# Patient Record
Sex: Female | Born: 1950 | Hispanic: No | Marital: Married | Smoking: Never smoker
Health system: Southern US, Community
[De-identification: ages and names within clinical notes are randomized; demographics above are authoritative.]

## PROBLEM LIST (undated history)

## (undated) DIAGNOSIS — E7800 Pure hypercholesterolemia, unspecified: Secondary | ICD-10-CM

---

## 2008-05-15 ENCOUNTER — Other Ambulatory Visit: Admission: RE | Admit: 2008-05-15 | Discharge: 2008-05-15 | Payer: Self-pay | Admitting: Family Medicine

## 2009-07-24 ENCOUNTER — Other Ambulatory Visit: Admission: RE | Admit: 2009-07-24 | Discharge: 2009-07-24 | Payer: Self-pay | Admitting: Family Medicine

## 2010-12-03 ENCOUNTER — Other Ambulatory Visit (HOSPITAL_COMMUNITY)
Admission: RE | Admit: 2010-12-03 | Discharge: 2010-12-03 | Disposition: A | Discharge status: Home / Self Care | Payer: BC Managed Care – PPO | Source: Ambulatory Visit | Attending: Family Medicine | Admitting: Family Medicine

## 2010-12-03 ENCOUNTER — Other Ambulatory Visit: Payer: Self-pay | Admitting: Family Medicine

## 2010-12-03 DIAGNOSIS — Z124 Encounter for screening for malignant neoplasm of cervix: Secondary | ICD-10-CM | POA: Insufficient documentation

## 2011-12-07 ENCOUNTER — Other Ambulatory Visit (HOSPITAL_COMMUNITY)
Admission: RE | Admit: 2011-12-07 | Discharge: 2011-12-07 | Disposition: A | Discharge status: Home / Self Care | Payer: BC Managed Care – PPO | Source: Ambulatory Visit | Attending: Family Medicine | Admitting: Family Medicine

## 2011-12-07 ENCOUNTER — Other Ambulatory Visit: Payer: Self-pay | Admitting: Family Medicine

## 2011-12-07 DIAGNOSIS — Z124 Encounter for screening for malignant neoplasm of cervix: Secondary | ICD-10-CM | POA: Insufficient documentation

## 2013-05-14 ENCOUNTER — Other Ambulatory Visit: Payer: Self-pay | Admitting: Family Medicine

## 2013-05-14 ENCOUNTER — Other Ambulatory Visit (HOSPITAL_COMMUNITY)
Admission: RE | Admit: 2013-05-14 | Discharge: 2013-05-14 | Disposition: A | Discharge status: Home / Self Care | Payer: Managed Care, Other (non HMO) | Source: Ambulatory Visit | Attending: Family Medicine | Admitting: Family Medicine

## 2013-05-14 DIAGNOSIS — Z124 Encounter for screening for malignant neoplasm of cervix: Secondary | ICD-10-CM | POA: Insufficient documentation

## 2013-05-14 DIAGNOSIS — Z1151 Encounter for screening for human papillomavirus (HPV): Secondary | ICD-10-CM | POA: Insufficient documentation

## 2013-05-22 ENCOUNTER — Other Ambulatory Visit: Payer: Self-pay | Admitting: Family Medicine

## 2013-05-22 ENCOUNTER — Ambulatory Visit
Admission: RE | Admit: 2013-05-22 | Discharge: 2013-05-22 | Disposition: A | Discharge status: Home / Self Care | Payer: Managed Care, Other (non HMO) | Source: Ambulatory Visit | Attending: Family Medicine | Admitting: Family Medicine

## 2013-05-22 DIAGNOSIS — R079 Chest pain, unspecified: Secondary | ICD-10-CM

## 2014-05-16 ENCOUNTER — Other Ambulatory Visit: Payer: Self-pay | Admitting: Family Medicine

## 2014-05-16 ENCOUNTER — Other Ambulatory Visit (HOSPITAL_COMMUNITY)
Admission: RE | Admit: 2014-05-16 | Discharge: 2014-05-16 | Disposition: A | Discharge status: Home / Self Care | Payer: Managed Care, Other (non HMO) | Source: Ambulatory Visit | Attending: Family Medicine | Admitting: Family Medicine

## 2014-05-16 DIAGNOSIS — Z124 Encounter for screening for malignant neoplasm of cervix: Secondary | ICD-10-CM | POA: Diagnosis not present

## 2014-05-17 LAB — CYTOLOGY - PAP

## 2014-06-25 IMAGING — CR DG RIBS W/ CHEST 3+V*L*
3 series · 3 of 3 positions shown · non-contrast
Comparison: None.

CLINICAL DATA: Chest pain

LEFT RIBS AND CHEST - 3+ VIEW

[w chest pa]
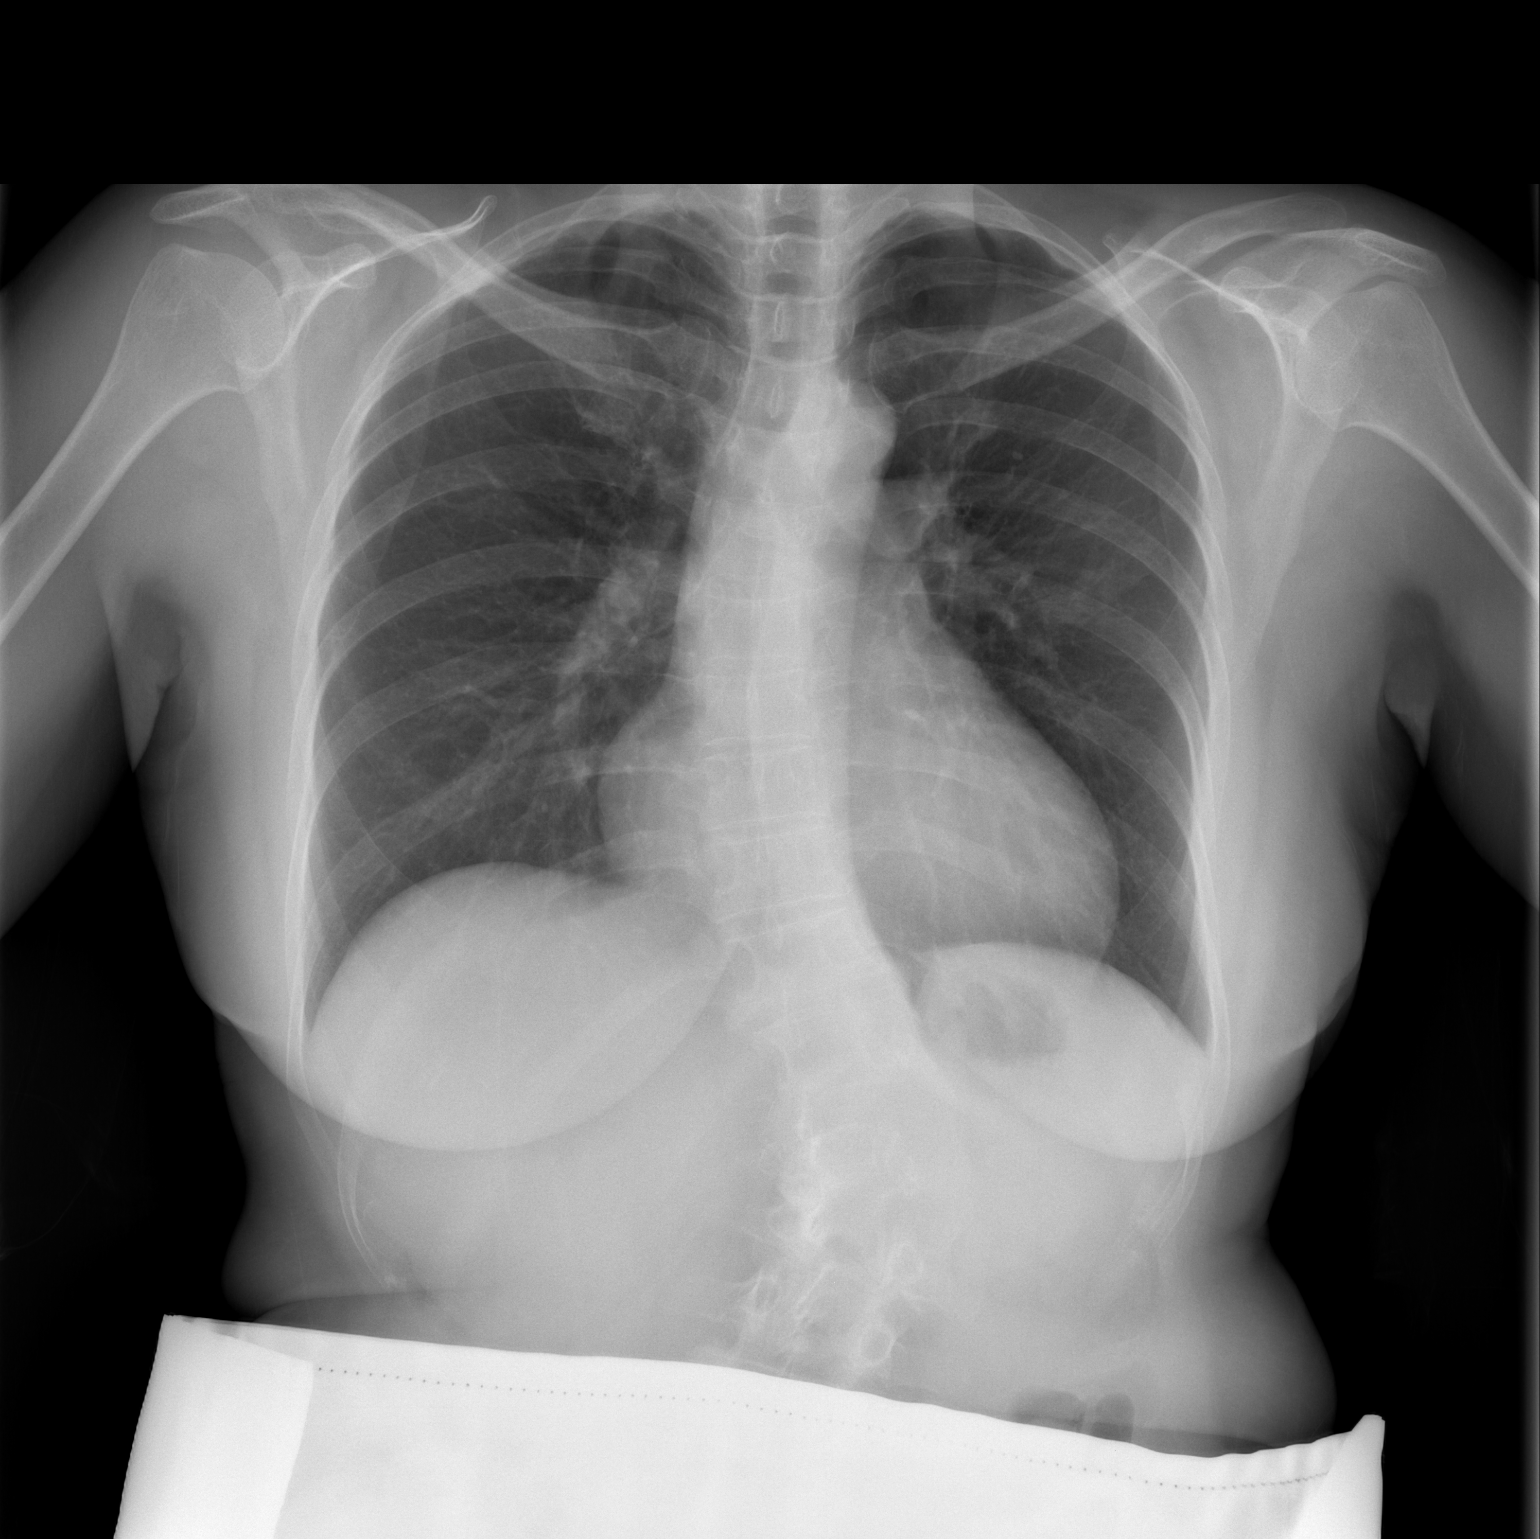

[w ribs ap/pa upper left *]
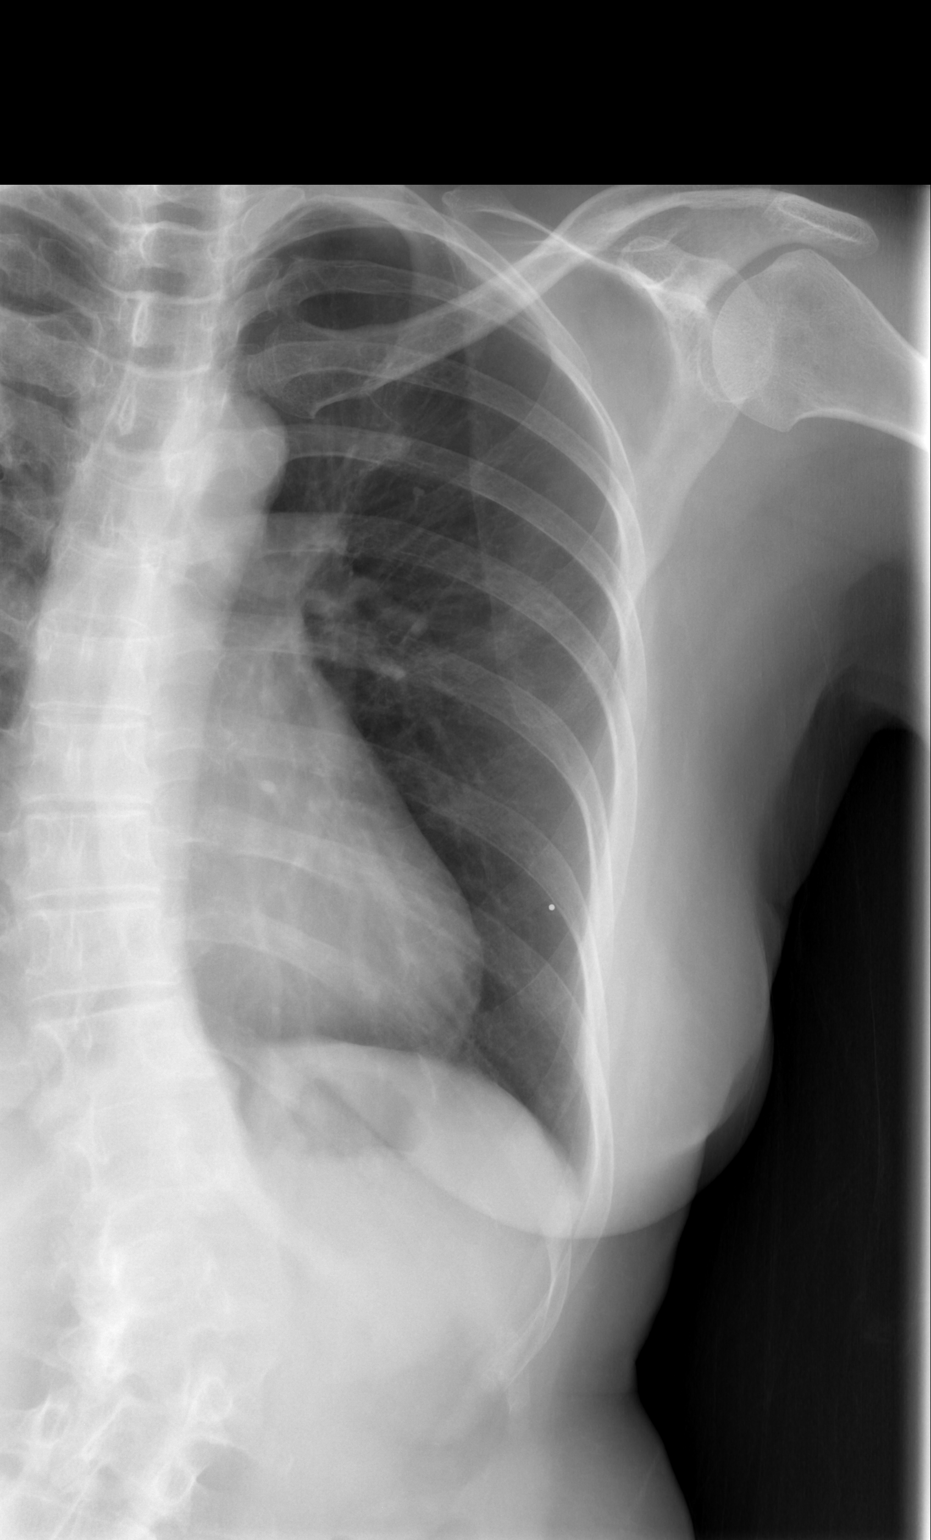

[w ribs oblique left *]
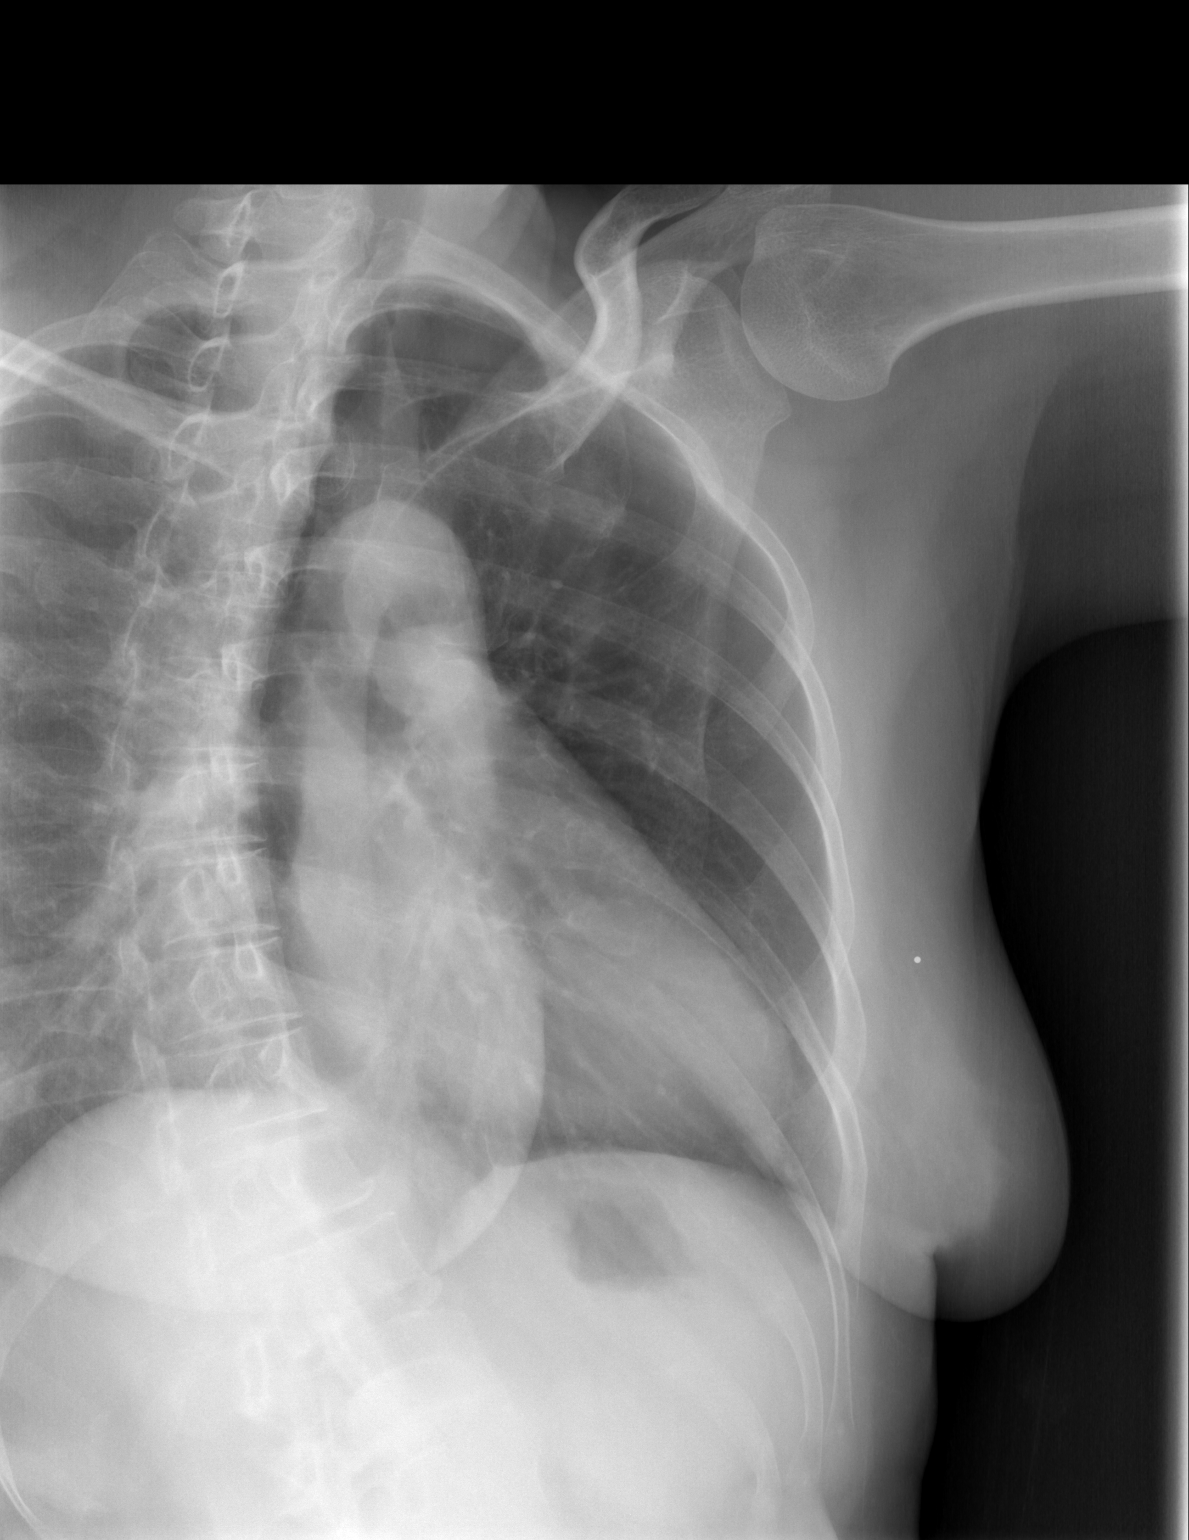

[3 of 3 positions shown; findings below may reference images not displayed]

FINDINGS: Moderate levoscoliosis in the upper lumbar spine.

Mild cardiac enlargement.  Negative for heart failure.  Lungs are
clear without infiltrate or effusion.

Negative for left rib fracture.  No rib lesion on the left.
IMPRESSION: Scoliosis.  No acute abnormality.

## 2015-07-07 ENCOUNTER — Emergency Department (HOSPITAL_COMMUNITY): Payer: Self-pay

## 2015-07-07 ENCOUNTER — Encounter (HOSPITAL_COMMUNITY): Payer: Self-pay | Admitting: Emergency Medicine

## 2015-07-07 ENCOUNTER — Observation Stay (HOSPITAL_COMMUNITY)
Admission: EM | Admit: 2015-07-07 | Discharge: 2015-07-08 | Disposition: A | Discharge status: Home / Self Care | Payer: Self-pay | Attending: Internal Medicine | Admitting: Internal Medicine

## 2015-07-07 DIAGNOSIS — E785 Hyperlipidemia, unspecified: Secondary | ICD-10-CM

## 2015-07-07 DIAGNOSIS — R0789 Other chest pain: Principal | ICD-10-CM | POA: Insufficient documentation

## 2015-07-07 DIAGNOSIS — Z79899 Other long term (current) drug therapy: Secondary | ICD-10-CM | POA: Insufficient documentation

## 2015-07-07 DIAGNOSIS — Z7982 Long term (current) use of aspirin: Secondary | ICD-10-CM | POA: Insufficient documentation

## 2015-07-07 DIAGNOSIS — R079 Chest pain, unspecified: Secondary | ICD-10-CM | POA: Diagnosis present

## 2015-07-07 DIAGNOSIS — E7800 Pure hypercholesterolemia, unspecified: Secondary | ICD-10-CM | POA: Insufficient documentation

## 2015-07-07 DIAGNOSIS — R072 Precordial pain: Secondary | ICD-10-CM

## 2015-07-07 HISTORY — DX: Pure hypercholesterolemia, unspecified: E78.00

## 2015-07-07 LAB — CBC
HCT: 40.3 % (ref 36.0–46.0)
Hemoglobin: 13.7 g/dL (ref 12.0–15.0)
MCH: 32 pg (ref 26.0–34.0)
MCHC: 34 g/dL (ref 30.0–36.0)
MCV: 94.2 fL (ref 78.0–100.0)
PLATELETS: 214 10*3/uL (ref 150–400)
RBC: 4.28 MIL/uL (ref 3.87–5.11)
RDW: 12.8 % (ref 11.5–15.5)
WBC: 5.5 10*3/uL (ref 4.0–10.5)

## 2015-07-07 LAB — BASIC METABOLIC PANEL
Anion gap: 9 (ref 5–15)
BUN: 20 mg/dL (ref 6–20)
CALCIUM: 9.7 mg/dL (ref 8.9–10.3)
CO2: 26 mmol/L (ref 22–32)
CREATININE: 0.77 mg/dL (ref 0.44–1.00)
Chloride: 104 mmol/L (ref 101–111)
GFR calc Af Amer: >60 mL/min (ref >60)
GFR calc non Af Amer: >60 mL/min (ref >60)
GLUCOSE: 106 mg/dL — AB (ref 65–99)
Potassium: 4.1 mmol/L (ref 3.5–5.1)
Sodium: 139 mmol/L (ref 135–145)

## 2015-07-07 LAB — I-STAT TROPONIN, ED: TROPONIN I, POC: 0 ng/mL (ref 0.00–0.08)

## 2015-07-07 NOTE — ED Notes (Signed)
Pt. reports left lower chest pain onset this morning with mild SOB , nausea  and diaphoresis , pt. took 1 baby ASA prior to arrival .

## 2015-07-08 ENCOUNTER — Observation Stay (HOSPITAL_COMMUNITY): Payer: Managed Care, Other (non HMO)

## 2015-07-08 ENCOUNTER — Encounter (HOSPITAL_COMMUNITY): Payer: Self-pay | Admitting: Urology

## 2015-07-08 ENCOUNTER — Other Ambulatory Visit (HOSPITAL_COMMUNITY): Payer: Self-pay | Admitting: Physician Assistant

## 2015-07-08 DIAGNOSIS — R079 Chest pain, unspecified: Secondary | ICD-10-CM

## 2015-07-08 DIAGNOSIS — R072 Precordial pain: Secondary | ICD-10-CM

## 2015-07-08 DIAGNOSIS — E785 Hyperlipidemia, unspecified: Secondary | ICD-10-CM

## 2015-07-08 LAB — NM MYOCAR MULTI W/SPECT W/WALL MOTION / EF
CHL CUP MPHR: 156 {beats}/min
CHL CUP RESTING HR STRESS: 55 {beats}/min
CSEPPHR: 103 {beats}/min
Percent HR: 66 %

## 2015-07-08 LAB — TROPONIN I
Troponin I: <0.03 ng/mL
Troponin I: <0.03 ng/mL

## 2015-07-08 LAB — D-DIMER, QUANTITATIVE: D-Dimer, Quant: 0.29 ug{FEU}/mL (ref 0.00–0.48)

## 2015-07-08 MED ORDER — ACETAMINOPHEN 325 MG PO TABS: 650.0000 mg | ORAL_TABLET | ORAL | Status: DC | PRN

## 2015-07-08 MED ORDER — TECHNETIUM TC 99M SESTAMIBI GENERIC - CARDIOLITE
30.0000 | Freq: Once | INTRAVENOUS | Status: AC | PRN
Start: 2015-07-08 — End: 2015-07-08
  Administered 2015-07-08: 30 via INTRAVENOUS

## 2015-07-08 MED ORDER — REGADENOSON 0.4 MG/5ML IV SOLN
0.4000 mg | Freq: Once | INTRAVENOUS | Status: AC
  Administered 2015-07-08: 0.4 mg via INTRAVENOUS
  Filled 2015-07-08: qty 5

## 2015-07-08 MED ORDER — ASPIRIN EC 81 MG PO TBEC
81.0000 mg | DELAYED_RELEASE_TABLET | Freq: Every day | ORAL | Status: DC
  Administered 2015-07-08: 81 mg via ORAL
  Filled 2015-07-08: qty 1

## 2015-07-08 MED ORDER — TECHNETIUM TC 99M SESTAMIBI GENERIC - CARDIOLITE
10.0000 | Freq: Once | INTRAVENOUS | Status: AC | PRN
  Administered 2015-07-08: 10 via INTRAVENOUS

## 2015-07-08 MED ORDER — REGADENOSON 0.4 MG/5ML IV SOLN
INTRAVENOUS | Status: AC
  Administered 2015-07-08: 0.4 mg via INTRAVENOUS
  Filled 2015-07-08: qty 5

## 2015-07-08 MED ORDER — ASPIRIN 81 MG PO CHEW
162.0000 mg | CHEWABLE_TABLET | Freq: Once | ORAL | Status: AC
  Administered 2015-07-08: 162 mg via ORAL
  Filled 2015-07-08: qty 2

## 2015-07-08 MED ORDER — ONDANSETRON HCL 4 MG/2ML IJ SOLN: 4.0000 mg | Freq: Four times a day (QID) | INTRAMUSCULAR | Status: DC | PRN

## 2015-07-08 MED ORDER — ENOXAPARIN SODIUM 40 MG/0.4ML ~~LOC~~ SOLN
40.0000 mg | SUBCUTANEOUS | Status: DC
  Administered 2015-07-08: 40 mg via SUBCUTANEOUS
  Filled 2015-07-08: qty 0.4

## 2015-07-08 MED ORDER — ALUM & MAG HYDROXIDE-SIMETH 200-200-20 MG/5ML PO SUSP: 15.0000 mL | ORAL | Status: DC | PRN

## 2015-07-08 MED ORDER — SIMVASTATIN 20 MG PO TABS
20.0000 mg | ORAL_TABLET | Freq: Every day | ORAL | Status: DC
  Administered 2015-07-08: 20 mg via ORAL
  Filled 2015-07-08: qty 1

## 2015-07-08 NOTE — Consult Note (Signed)
CARDIOLOGY CONSULT NOTE  Patient ID: Helen King, MRN: [id number], DOB/AGE: 05-12-1951 64 y.o. Admit date: 07/07/2015 Date of Consult: 07/08/2015  Primary Physician: Dr Stephanie Acre Primary Cardiologist: none Referring Physician: Dr Alcario Drought  Chief Complaint: Chest pain Reason for Consultation: Chest pain  HPI: 64 year old woman who presented for evaluation of chest pain. The patient has no personal history of cardiac disease. She was driving home from vacation in [place] with her husband when she developed pain under the left breast. Pain is described as a pressure-like sensation. The pain was nonradiating and was not associated with deep breathing or coughing. She had one similar episode but this was many years ago and she did not seek evaluation. Symptoms lasted approximately one hour. There was associated nausea and she states she felt hot but did not break out in a sweat. There was no associated dyspnea, back pain, lightheadedness, or heart palpitations. Her symptoms have resolved this morning. Cardiology is asked to evaluate her for consideration of further testing.  The patient is treated for hypercholesterolemia. She reports compliance with a statin drug. She also takes aspirin 81 mg daily. She has no history of hypertension and she does not smoke cigarettes.  Medical History:  Past Medical History  Diagnosis Date  . Hypercholesterolemia       Surgical History: History reviewed. No pertinent past surgical history.   Home Meds: Prior to Admission medications   Medication Sig Start Date End Date Taking? Authorizing Provider  aspirin EC 81 MG tablet Take 81 mg by mouth daily.   Yes Historical Provider, MD  cholecalciferol (VITAMIN D) 1000 UNITS tablet Take 1,000 Units by mouth daily.   Yes Historical Provider, MD  Multiple Vitamin (MULTIVITAMIN WITH MINERALS) TABS tablet Take 1 tablet by mouth daily.   Yes Historical Provider, MD  Omega-3 Fatty Acids (FISH OIL) 1000 MG CAPS Take  1,000 mg by mouth daily.   Yes Historical Provider, MD  simvastatin (ZOCOR) 20 MG tablet Take 20 mg by mouth daily. 04/15/15  Yes Historical Provider, MD  TURMERIC PO Take 1 Dose by mouth daily.   Yes Historical Provider, MD  vitamin C (ASCORBIC ACID) 250 MG tablet Take 250 mg by mouth daily.   Yes Historical Provider, MD    Inpatient Medications:  . aspirin EC  81 mg Oral Daily  . enoxaparin (LOVENOX) injection  40 mg Subcutaneous Q24H  . simvastatin  20 mg Oral Daily      Allergies: No Known Allergies  Social History   Social History  . Marital Status: Married    Spouse Name: N/A  . Number of Children: N/A  . Years of Education: N/A   Occupational History  . Not on file.   Social History Main Topics  . Smoking status: Never Smoker   . Smokeless tobacco: Not on file  . Alcohol Use: Yes  . Drug Use: No  . Sexual Activity: Not on file   Other Topics Concern  . Not on file   Social History Narrative     Family history: There is no history of premature coronary artery disease in any first-degree relatives  Review of Systems: General: negative for chills, fever, night sweats or weight changes.  ENT: negative for rhinorrhea or epistaxis Cardiovascular: See history of present illness Dermatological: negative for rash Respiratory: negative for cough or wheezing GI: Positive for nausea; negative for vomiting, diarrhea, bright red blood per rectum, melena, or hematemesis GU: no hematuria, urgency, or frequency Neurologic: negative for visual changes, syncope,  headache, or dizziness Heme: no easy bruising or bleeding Endo: negative for excessive thirst, thyroid disorder, or flushing Musculoskeletal: negative for joint pain or swelling, negative for myalgias  All other systems reviewed and are otherwise negative except as noted above.  Physical Exam: Blood pressure 111/66, pulse 54, temperature 98 F (36.7 C), temperature source Oral, resp. rate 16, height 5' (1.524 m),  weight 118 lb 3.2 oz (53.615 kg), SpO2 95 %. Pt is alert and oriented, WD, WN, in no distress. HEENT: normal Neck: JVP normal. Carotid upstrokes normal without bruits. No thyromegaly. Lungs: equal expansion, clear bilaterally CV: Apex is discrete and nondisplaced, RRR without murmur or gallop Abd: soft, NT, +BS, no bruit, no hepatosplenomegaly Back: no CVA tenderness Ext: no C/C/E        DP/PT pulses intact and = Skin: warm and dry without rash Neuro: CNII-XII intact             Strength intact = bilaterally   Labs:  Recent Labs  07/08/15 0250 07/08/15 0840  TROPONINI <0.03 <0.03   Lab Results  Component Value Date   WBC 5.5 07/07/2015   HGB 13.7 07/07/2015   HCT 40.3 07/07/2015   MCV 94.2 07/07/2015   PLT 214 07/07/2015    Recent Labs Lab 07/07/15 2100  NA 139  K 4.1  CL 104  CO2 26  BUN 20  CREATININE 0.77  CALCIUM 9.7  GLUCOSE 106*   No results found for: CHOL, HDL, LDLCALC, TRIG Lab Results  Component Value Date   DDIMER 0.29 07/08/2015    Radiology/Studies:  Dg Chest 2 View  07/07/2015   CLINICAL DATA:  Left lower chest pain with mild dyspnea and nausea. Diaphoresis. Onset this morning.  EXAM: CHEST  2 VIEW  COMPARISON:  05/22/13  FINDINGS: The heart size and mediastinal contours are within normal limits. Both lungs are clear. The visualized skeletal structures are unremarkable.  IMPRESSION: No active cardiopulmonary disease.   Electronically Signed   By: Andreas Newport M.D.   On: 07/07/2015 21:12    EKG: NSR, within normal limits  Cardiac Studies: Troponin negative x 2  ASSESSMENT AND PLAN:  1. Chest pain with typical and atypical features: Patient's primary cardiovascular risk factor is hypercholesterolemia. D-dimer is negative making pulmonary embolus unlikely. Further risk stratification is indicated, and I have recommended a gated coronary CTA to evaluate for obstructive coronary artery disease. Further disposition pending the results of  testing. The patient is chest pain-free at present.  2. Hypercholesterolemia: Patient treated with simvastatin, followed by primary care physician.  Deatra James MD, [facility] 07/08/2015, 9:34 AM

## 2015-07-08 NOTE — Progress Notes (Signed)
Pt arrived to floor in NAD, no pain, VSS, pt oriented to room and floor, call bell and phone within reach.

## 2015-07-08 NOTE — Progress Notes (Signed)
    Patient's nuclear stress test returned non diagnostic due to a large amount of gut attenuation. She has had no further chest pain. I spoke with Dr. Burt Knack who felt like she could be discharged home with outpatient stress ECHO. I have arranged for this to be done tomorrow morning with Richardson Dopp PA-C in our [address] [facility] and I will see her in follow up on Friday afternoon. I spoke with the patient who is agreeable with this plan.   Angelena Form PA-C  [facility]

## 2015-07-08 NOTE — Discharge Summary (Addendum)
Physician Discharge Summary  Helen King VPX:[id number] DOB: [birth year] DOA: 07/07/2015  PCP: No primary care provider on file.  Admit date: 07/07/2015 Discharge date: 07/08/2015  Time spent: 20 minutes  Recommendations for Outpatient Follow-up:  1. Follow up with outpatient stress echo as scheduled 2. Follow up with PCP in 1-2 weeks  Discharge Diagnoses:  Active Problems:   Chest pain with low risk for cardiac etiology   Chest pain   HLD (hyperlipidemia)   Discharge Condition: Stable  Diet recommendation: Heart healthy  Filed Weights   07/08/15 0328  Weight: 53.615 kg (118 lb 3.2 oz)    History of present illness:  Please see admit h and p from 10/4 for details. Briefly, pt presents with chest pains, felt to have both typical and atypical components. The patient was admitted for further work up.  Hospital Course:  The patient was admitted to the floor. Cardiac enzymes were found to be neg x 3. Cardiology was consulted and the patient underwent nuclear stress testing on 10/4. Unfortunately, the study was non-diagnostic due to large amount to gut attenuation. Case was discussed with Cardiology, who recommends outpatient stress echo to be arranged on 10/5. The patient otherwise remained stable for discharge and close outpatient follow up.  Procedures:  Stress test 10/4 - non-diagnostic results  Consultations:  Cardiology  Discharge Exam: Filed Vitals:   07/08/15 1307 07/08/15 1309 07/08/15 1311 07/08/15 1629  BP: 115/69 115/69 117/70 109/54  Pulse: 99 83 78 67  Temp:    98.3 F (36.8 C)  TempSrc:    Oral  Resp:    18  Height:      Weight:      SpO2:    100%    General: Awake, in nad Cardiovascular: regular, s1, s2 Respiratory: normal resp effort, no wheezing  Discharge Instructions     Medication List    TAKE these medications        aspirin EC 81 MG tablet  Take 81 mg by mouth daily.     cholecalciferol 1000 UNITS tablet  Commonly known as:   VITAMIN D  Take 1,000 Units by mouth daily.     Fish Oil 1000 MG Caps  Take 1,000 mg by mouth daily.     multivitamin with minerals Tabs tablet  Take 1 tablet by mouth daily.     simvastatin 20 MG tablet  Commonly known as:  ZOCOR  Take 20 mg by mouth daily.     TURMERIC PO  Take 1 Dose by mouth daily.     vitamin C 250 MG tablet  Commonly known as:  ASCORBIC ACID  Take 250 mg by mouth daily.       No Known Allergies Follow-up Information    Follow up with Richardson Dopp, PA-C On 07/09/2015.   Specialties:  Physician Assistant, Radiology, Interventional Cardiology   Why:  @ 9:50am for your stress ECHO. Nothing to eat after midnight    Contact information:   [address] [address] [phone]       Follow up with Eileen Stanford, PA-C On 07/11/2015.   Specialties:  Cardiology, Radiology   Why:  @ 1:30pm   Contact information:   [address] [place] [address] [phone]       Follow up with Follow up with your PCP in 1-2 weeks.   Why:  Hospital follow up       The results of significant diagnostics from this hospitalization (  including imaging, microbiology, ancillary and laboratory) are listed below for reference.    Significant Diagnostic Studies: Dg Chest 2 View  07/07/2015   CLINICAL DATA:  Left lower chest pain with mild dyspnea and nausea. Diaphoresis. Onset this morning.  EXAM: CHEST  2 VIEW  COMPARISON:  05/22/13  FINDINGS: The heart size and mediastinal contours are within normal limits. Both lungs are clear. The visualized skeletal structures are unremarkable.  IMPRESSION: No active cardiopulmonary disease.   Electronically Signed   By: Andreas Newport M.D.   On: 07/07/2015 21:12   Nm Myocar Multi W/spect W/wall Motion / Ef  07/08/2015   CLINICAL DATA:  64 year old with precordial chest pain.  EXAM: MYOCARDIAL IMAGING WITH SPECT (REST AND PHARMACOLOGIC-STRESS)  GATED LEFT VENTRICULAR WALL MOTION STUDY   LEFT VENTRICULAR EJECTION FRACTION  TECHNIQUE: Standard myocardial SPECT imaging was performed after resting intravenous injection of 10 mCi Tc-73m sestamibi. Subsequently, intravenous infusion of Lexiscan was performed under the supervision of the Cardiology staff. At peak effect of the drug, 30 mCi Tc-37m sestamibi was injected intravenously and standard myocardial SPECT imaging was performed. Quantitative gated imaging was also performed to evaluate left ventricular wall motion, and estimate left ventricular ejection fraction.  COMPARISON:  Chest radiograph 07/07/2015  FINDINGS: Perfusion: There is a large amount of the radiopharmaceutical uptake in the stomach. The stomach activity did not clear with walking or by drinking fluids. As a result, the study is non diagnostic. Cannot exclude reversibility along the anterior wall. Uptake at the apex is uncertain. Question decreased uptake along the lateral wall on both rest and stress images is equivocal.  Wall Motion: Unusual wall motion related to the stomach artifact.  Left Ventricular Ejection Fraction: 72 %  End diastolic volume 50 ml  End systolic volume 14 ml  IMPRESSION: The study is non diagnostic due to a large amount of gut uptake.   Electronically Signed   By: Markus Daft M.D.   On: 07/08/2015 15:18    Microbiology: No results found for this or any previous visit (from the past 240 hour(s)).   Labs: Basic Metabolic Panel:  Recent Labs Lab 07/07/15 2100  NA 139  K 4.1  CL 104  CO2 26  GLUCOSE 106*  BUN 20  CREATININE 0.77  CALCIUM 9.7   Liver Function Tests: No results for input(s): AST, ALT, ALKPHOS, BILITOT, PROT, ALBUMIN in the last 168 hours. No results for input(s): LIPASE, AMYLASE in the last 168 hours. No results for input(s): AMMONIA in the last 168 hours. CBC:  Recent Labs Lab 07/07/15 2100  WBC 5.5  HGB 13.7  HCT 40.3  MCV 94.2  PLT 214   Cardiac Enzymes:  Recent Labs Lab 07/08/15 0250 07/08/15 0840  07/08/15 1453  TROPONINI <0.03 <0.03 <0.03   BNP: BNP (last 3 results) No results for input(s): BNP in the last 8760 hours.  ProBNP (last 3 results) No results for input(s): PROBNP in the last 8760 hours.  CBG: No results for input(s): GLUCAP in the last 168 hours.   Signed:  Cuauhtemoc Huegel, Orpah Melter  [facility] 07/08/2015, 5:33 PM

## 2015-07-08 NOTE — Progress Notes (Signed)
Cardiology Office Note   Date:  07/08/2015   ID:  Helen King, DOB 1951/05/30, MRN [id number]  PCP:  No primary care provider on file.  Cardiologist:    Electrophysiologist:    No chief complaint on file.    History of Present Illness: Helen King is a 64 y.o. female with a hx of    Studies/Reports Reviewed Today:  Myoview 07/08/15 FINDINGS: Perfusion: There is a large amount of the radiopharmaceutical uptake in the stomach. The stomach activity did not clear with walking or by drinking fluids. As a result, the study is non diagnostic. Cannot exclude reversibility along the anterior wall. Uptake at the apex is uncertain. Question decreased uptake along the lateral wall on both rest and stress images is equivocal.  Wall Motion: Unusual wall motion related to the stomach artifact.  Left Ventricular Ejection Fraction: 72 %  End diastolic volume 50 ml  End systolic volume 14 ml  IMPRESSION: The study is non diagnostic due to a large amount of gut uptake.  Past Medical History  Diagnosis Date  . Hypercholesterolemia     No past surgical history on file.   No current facility-administered medications for this visit.   No current outpatient prescriptions on file.   Facility-Administered Medications Ordered in Other Visits  Medication Dose Route Frequency Provider Last Rate Last Dose  . acetaminophen (TYLENOL) tablet 650 mg  650 mg Oral Q4H PRN Etta Quill, DO      . alum & mag hydroxide-simeth (MAALOX/MYLANTA) 200-200-20 MG/5ML suspension 15 mL  15 mL Oral Q4H PRN Donne Hazel, MD      . aspirin EC tablet 81 mg  81 mg Oral Daily Etta Quill, DO   81 mg at 07/08/15 5366  . enoxaparin (LOVENOX) injection 40 mg  40 mg Subcutaneous Q24H Etta Quill, DO   40 mg at 07/08/15 4403  . ondansetron (ZOFRAN) injection 4 mg  4 mg Intravenous Q6H PRN Etta Quill, DO      . simvastatin (ZOCOR) tablet 20 mg  20 mg Oral Daily Etta Quill, DO    20 mg at 07/08/15 4742    Allergies:   Review of patient's allergies indicates no known allergies.    Social History:  The patient  reports that she has never smoked. She does not have any smokeless tobacco history on file. She reports that she drinks alcohol. She reports that she does not use illicit drugs.   Family History:  The patient's family history is not on file.    ROS:   Please see the history of present illness.   ROS    PHYSICAL EXAM: VS:  There were no vitals taken for this visit.    Wt Readings from Last 3 Encounters:  07/08/15 118 lb 3.2 oz (53.615 kg)     GEN: Well nourished, well developed, in no acute distress HEENT: normal Neck: no JVD, no carotid bruits, no masses Cardiac:  Normal S1/S2, RRR; no murmur ,  no rubs or gallops, no edema  Respiratory:  clear to auscultation bilaterally, no wheezing, rhonchi or rales. GI: soft, nontender, nondistended, + BS MS: no deformity or atrophy Skin: warm and dry  Neuro:  CNs II-XII intact, Strength and sensation are intact Psych: Normal affect   EKG:  EKG is ordered today.  It demonstrates:      Recent Labs: 07/07/2015: BUN 20; Creatinine, Ser 0.77; Hemoglobin 13.7; Platelets 214; Potassium 4.1; Sodium 139    Lipid Panel  No results found for: CHOL, TRIG, HDL, CHOLHDL, VLDL, LDLCALC, LDLDIRECT    ASSESSMENT AND PLAN:      Medication Changes: Current medicines are reviewed at length with the patient today.  Concerns regarding medicines are as outlined above.  The following changes have been made:   Discontinued Medications   No medications on file   Modified Medications   No medications on file   New Prescriptions   No medications on file    Labs/ tests ordered today include:   No orders of the defined types were placed in this encounter.      Disposition:    FU with    Signed, Richardson Dopp, PA-C, [facility] 07/08/2015 5:47 PM    [facility] [place], [place],  [place]  [address] Phone: [phone]; Fax: [phone]    This encounter was created in error - please disregard.

## 2015-07-08 NOTE — Progress Notes (Signed)
     The patient was seen in nuclear medicine for a lexiscan myoview. She tolerated the procedure well. Await nuclear images.    Perry Mount PA-C  [facility]

## 2015-07-08 NOTE — ED Provider Notes (Signed)
CSN: [id number]   Arrival date & time 07/07/15 2043  History  By signing my name below, I, Helen King, attest that this documentation has been prepared under the direction and in the presence of Varney Biles, MD. Electronically Signed: Altamease King, ED Scribe. 07/08/2015. 1:22 AM.  Chief Complaint  Patient presents with  . Chest Pain    HPI The history is provided by the patient. No language interpreter was used.   Helen King is a 64 y.o. female with PMHx of hypercholesteremia who presents to the Emergency Department complaining of intermittent left-sided chest pain with onset 1 week ago. The pain happened randomly 2-3 times in the last week and each episode lasts for minutes. She describes the pain as pressure under the left breast. Today she was driving from [place] and had more frequent episodes. The pain worsened around 6 PM and radiated to the back and left jaw. The episode lasted for 15 minutes and then improved. At present she has a mild burning pain at the left side of the chest. Pt had similar pain in the distant past but was not evaluated for it. Associated symptoms include SOB, nausea,feeling hot, and dizziness. Pt denies diaphoresis. Took 2 low dose aspirin PTA. Denies tobacco use. No history of DM or HTN. No confirmed family history of cardiac disease but she believes that her brother may have died of a heart attack.   Past Medical History  Diagnosis Date  . Hypercholesterolemia     History reviewed. No pertinent past surgical history.  No family history on file.  Social History  Substance Use Topics  . Smoking status: Never Smoker   . Smokeless tobacco: None  . Alcohol Use: Yes     Review of Systems  Constitutional: Negative for fever and chills.  Gastrointestinal: Negative for nausea and vomiting.  Neurological: Negative for weakness.  All other systems reviewed and are negative.  Home Medications   Prior to Admission medications   Medication Sig  Start Date End Date Taking? Authorizing Provider  aspirin EC 81 MG tablet Take 81 mg by mouth daily.   Yes Historical Provider, MD  cholecalciferol (VITAMIN D) 1000 UNITS tablet Take 1,000 Units by mouth daily.   Yes Historical Provider, MD  Multiple Vitamin (MULTIVITAMIN WITH MINERALS) TABS tablet Take 1 tablet by mouth daily.   Yes Historical Provider, MD  Omega-3 Fatty Acids (FISH OIL) 1000 MG CAPS Take 1,000 mg by mouth daily.   Yes Historical Provider, MD  simvastatin (ZOCOR) 20 MG tablet Take 20 mg by mouth daily. 04/15/15  Yes Historical Provider, MD  TURMERIC PO Take 1 Dose by mouth daily.   Yes Historical Provider, MD  vitamin C (ASCORBIC ACID) 250 MG tablet Take 250 mg by mouth daily.   Yes Historical Provider, MD    Allergies  Review of patient's allergies indicates no known allergies.  Triage Vitals: BP 139/71 mmHg  Pulse 63  Temp(Src) 99.3 F (37.4 C) (Oral)  Resp 19  SpO2 100%  Physical Exam  Constitutional: She is oriented to person, place, and time. She appears well-developed and well-nourished. No distress.  HENT:  Head: Normocephalic and atraumatic.  Neck: Normal range of motion. No JVD present.  Cardiovascular: Normal rate.   Radial pulses 2+ and equal  Pulmonary/Chest: Effort normal. No respiratory distress.  CTAB  Abdominal: Soft.  Musculoskeletal: Normal range of motion.  Neurological: She is alert and oriented to person, place, and time.  Skin: Skin is warm and dry. She is not  diaphoretic.  Psychiatric: She has a normal mood and affect.  Nursing note and vitals reviewed.   ED Course  Procedures   DIAGNOSTIC STUDIES: Oxygen Saturation is 100% on RA, normal by my interpretation.    COORDINATION OF CARE: 1:21 AM Discussed treatment plan which includes CXR, EKG, lab work, aspirin, and potential admission to the hospital with pt at bedside and pt agreed to plan.  Labs Reviewed  BASIC METABOLIC PANEL - Abnormal; Notable for the following:    Glucose,  Bld 106 (*)    All other components within normal limits  CBC  I-STAT TROPOININ, ED    Imaging Review Dg Chest 2 View  07/07/2015   CLINICAL DATA:  Left lower chest pain with mild dyspnea and nausea. Diaphoresis. Onset this morning.  EXAM: CHEST  2 VIEW  COMPARISON:  05/22/13  FINDINGS: The heart size and mediastinal contours are within normal limits. Both lungs are clear. The visualized skeletal structures are unremarkable.  IMPRESSION: No active cardiopulmonary disease.   Electronically Signed   By: Andreas Newport M.D.   On: 07/07/2015 21:12   I personally reviewed and evaluated these images and lab results as a part of my medical decision-making.   EKG Interpretation  Date/Time:  Monday July 07 2015 20:51:24 EDT Ventricular Rate:  69 PR Interval:  158 QRS Duration: 72 QT Interval:  414 QTC Calculation: 443 R Axis:   -70 Text Interpretation:  Normal sinus rhythm Left axis deviation Low voltage QRS Possible Anterolateral infarct , age undetermined Abnormal ECG No acute changes Confirmed by Kathrynn Humble, MD, Thelma Comp [phone]) on 07/08/2015 12:25:55 AM    MDM   Final diagnoses:  Chest pain, unspecified chest pain type     I personally performed the services described in this documentation, which was scribed in my presence. The recorded information has been reviewed and is accurate.  Pt comes in with cc of chest pain. Chest pain x 1 week, but more frequent today and more severe. Has typical features - radiation to back, jaw, and pressule like L sided pain with some nausea, dib and dizziness. HEAR score is 4 - 2 for history and 1 for age and risk factors. Will admit. She is currently chest pain free.   Varney Biles, MD 07/08/15 [phone]

## 2015-07-08 NOTE — H&P (Signed)
[facility] History and Physical  Helen King WUJ:[id number] DOB: 1950-11-18 DOA: 07/07/2015  Referring physician: EDP PCP: No primary care provider on file.   Chief Complaint: Chest pain   HPI: Helen King is a 64 y.o. female with h/o HLD.  Patient presents to the ED with c/o intermittent left sided chest pain onset 1 week ago.  2-3 weeks ago patient went on a trip to [place].  CP onset after return of trip.  No leg pain nor swelling.  Pain occurs randomly 2-3 times last week, each episode lasting for mins.  There is no association with activity.  Despite very heavy walking over the weekend while going shopping in [place] she didn't have chest pain.  Chest pain became worse yesterday during a car trip (while sitting) returning to [place] from [place] at 6 PM.  Pain radiated to back and left jaw.  Episode lasted for 15 mins and improved.  Review of Systems: Systems reviewed.  As above, otherwise negative  Past Medical History  Diagnosis Date  . Hypercholesterolemia    History reviewed. No pertinent past surgical history. Social History:  reports that she has never smoked. She does not have any smokeless tobacco history on file. She reports that she drinks alcohol. She reports that she does not use illicit drugs.  No Known Allergies  No family history on file.   Prior to Admission medications   Medication Sig Start Date End Date Taking? Authorizing Provider  aspirin EC 81 MG tablet Take 81 mg by mouth daily.   Yes Historical Provider, MD  cholecalciferol (VITAMIN D) 1000 UNITS tablet Take 1,000 Units by mouth daily.   Yes Historical Provider, MD  Multiple Vitamin (MULTIVITAMIN WITH MINERALS) TABS tablet Take 1 tablet by mouth daily.   Yes Historical Provider, MD  Omega-3 Fatty Acids (FISH OIL) 1000 MG CAPS Take 1,000 mg by mouth daily.   Yes Historical Provider, MD  simvastatin (ZOCOR) 20 MG tablet Take 20 mg by mouth daily. 04/15/15  Yes Historical Provider, MD  TURMERIC PO Take  1 Dose by mouth daily.   Yes Historical Provider, MD  vitamin C (ASCORBIC ACID) 250 MG tablet Take 250 mg by mouth daily.   Yes Historical Provider, MD   Physical Exam: Filed Vitals:   07/08/15 0130  BP: 142/68  Pulse: 62  Temp:   Resp: 19    BP 142/68 mmHg  Pulse 62  Temp(Src) 99.3 F (37.4 C) (Oral)  Resp 19  SpO2 100%  General Appearance:    Alert, oriented, no distress, appears stated age  Head:    Normocephalic, atraumatic  Eyes:    PERRL, EOMI, sclera non-icteric        Nose:   Nares without drainage or epistaxis. Mucosa, turbinates normal  Throat:   Moist mucous membranes. Oropharynx without erythema or exudate.  Neck:   Supple. No carotid bruits.  No thyromegaly.  No lymphadenopathy.   Back:     No CVA tenderness, no spinal tenderness  Lungs:     Clear to auscultation bilaterally, without wheezes, rhonchi or rales  Chest wall:    No tenderness to palpitation  Heart:    Regular rate and rhythm without murmurs, gallops, rubs  Abdomen:     Soft, non-tender, nondistended, normal bowel sounds, no organomegaly  Genitalia:    deferred  Rectal:    deferred  Extremities:   No clubbing, cyanosis or edema.  Pulses:   2+ and symmetric all extremities  Skin:   Skin color,  texture, turgor normal, no rashes or lesions  Lymph nodes:   Cervical, supraclavicular, and axillary nodes normal  Neurologic:   CNII-XII intact. Normal strength, sensation and reflexes      throughout    Labs on Admission:  Basic Metabolic Panel:  Recent Labs Lab 07/07/15 2100  NA 139  K 4.1  CL 104  CO2 26  GLUCOSE 106*  BUN 20  CREATININE 0.77  CALCIUM 9.7   Liver Function Tests: No results for input(s): AST, ALT, ALKPHOS, BILITOT, PROT, ALBUMIN in the last 168 hours. No results for input(s): LIPASE, AMYLASE in the last 168 hours. No results for input(s): AMMONIA in the last 168 hours. CBC:  Recent Labs Lab 07/07/15 2100  WBC 5.5  HGB 13.7  HCT 40.3  MCV 94.2  PLT 214   Cardiac  Enzymes: No results for input(s): CKTOTAL, CKMB, CKMBINDEX, TROPONINI in the last 168 hours.  BNP (last 3 results) No results for input(s): PROBNP in the last 8760 hours. CBG: No results for input(s): GLUCAP in the last 168 hours.  Radiological Exams on Admission: Dg Chest 2 View  07/07/2015   CLINICAL DATA:  Left lower chest pain with mild dyspnea and nausea. Diaphoresis. Onset this morning.  EXAM: CHEST  2 VIEW  COMPARISON:  05/22/13  FINDINGS: The heart size and mediastinal contours are within normal limits. Both lungs are clear. The visualized skeletal structures are unremarkable.  IMPRESSION: No active cardiopulmonary disease.   Electronically Signed   By: Andreas Newport M.D.   On: 07/07/2015 21:12    EKG: Independently reviewed.  Assessment/Plan Active Problems:   Chest pain with low risk for cardiac etiology   Chest pain   1. Chest pain - HEART score of 3 (history has both typical and atypical features) 1. Checking D.Dimer given all of the recent travel she has had (round trips to [place] within a couple week period both in car).  If positive needs CTA chest 2. Chest pain obs pathway 3. Serial trops 4. Tele monitor 5. NPO 6. Call cards in AM for possible stress test  Code Status: Full Code  Family Communication: Family at bedside Disposition Plan: Admit to obs  Time spent: 50 min  Tanija Germani M. [facility] Pager [phone]  If 7AM-7PM, please contact the day team taking care of the patient [email or web address] Password [email or web address] 07/08/2015, 2:54 AM

## 2015-07-09 ENCOUNTER — Other Ambulatory Visit: Payer: Self-pay

## 2015-07-09 ENCOUNTER — Encounter: Payer: Managed Care, Other (non HMO) | Admitting: Physician Assistant

## 2015-07-10 ENCOUNTER — Ambulatory Visit (HOSPITAL_BASED_OUTPATIENT_CLINIC_OR_DEPARTMENT_OTHER): Payer: Self-pay

## 2015-07-10 ENCOUNTER — Ambulatory Visit (HOSPITAL_COMMUNITY): Payer: Self-pay | Attending: Cardiovascular Disease

## 2015-07-10 DIAGNOSIS — R0989 Other specified symptoms and signs involving the circulatory and respiratory systems: Secondary | ICD-10-CM

## 2015-07-10 DIAGNOSIS — E7800 Pure hypercholesterolemia, unspecified: Secondary | ICD-10-CM | POA: Insufficient documentation

## 2015-07-10 DIAGNOSIS — R079 Chest pain, unspecified: Secondary | ICD-10-CM | POA: Insufficient documentation

## 2015-07-11 ENCOUNTER — Encounter: Payer: Self-pay | Admitting: Physician Assistant

## 2015-07-11 ENCOUNTER — Ambulatory Visit (INDEPENDENT_AMBULATORY_CARE_PROVIDER_SITE_OTHER): Payer: Self-pay | Admitting: Physician Assistant

## 2015-07-11 VITALS — BP 102/72 | HR 81 | Ht 60.0 in | Wt 115.8 lb

## 2015-07-11 DIAGNOSIS — R079 Chest pain, unspecified: Secondary | ICD-10-CM

## 2015-07-11 NOTE — Patient Instructions (Signed)
Medication Instructions:   Your physician recommends that you continue on your current medications as directed. Please refer to the Current Medication list given to you today.  Labwork:  NONE ORDER TODAY  Testing/Procedures:  NONE ORDER TODAY   Follow-Up:  CALL THE OFFICE  IF  YOU HAVE ANY CARDIAC RELATED SYMPTOMS  Any Other Special Instructions Will Be Listed Below (If Applicable).

## 2015-07-11 NOTE — Progress Notes (Signed)
Cardiology Office Note    Date:  07/11/2015   ID:  Helen King, DOB Nov 22, 1950, MRN [id number]  PCP:  Lilian Coma, MD  Cardiologist:  Seen by Burt Knack inpatient     History of Present Illness: Helen King is a 64 y.o. female with a hx of HLD and no prior cardiac hx who presents for post hospital follow up after a CP rule out.  She presented with chest pain with typical and atypical features: Patient's primary cardiovascular risk factor is hypercholesterolemia. D-dimer is negative making pulmonary embolus unlikely. The patient was admitted to the floor. Cardiac enzymes were found to be neg x 3. Cardiology was consulted and the patient underwent nuclear stress testing on 10/4. Unfortunately, the study was non-diagnostic due to large amount to gut attenuation. She was not having anymore chest pain so outpatient stress echo was arranged on 07/09/15. She had this test which was completely normal and presents to clinic today to discuss the results.   No further CP. No SOB, LE edema, PND, orthopnea, palpitations or syncope. She wants to know what caused her CP. I went over different causes of chest pain including GERD, esophogeal spasm and costochondritis.   Studies:  - Stress Echo (07/09/15): The estimated LV ejection fraction was 60%. Normal wall motion; no LV regional wall motion abnormalities.  - Nuclear (07/08/15):  non diagnostic due to a large amount of gut attenuation    Recent Labs/Images:   Recent Labs  07/07/15 2100  NA 139  K 4.1  BUN 20  CREATININE 0.77  HGB 13.7     Dg Chest 2 View  07/07/2015   CLINICAL DATA:  Left lower chest pain with mild dyspnea and nausea. Diaphoresis. Onset this morning.  EXAM: CHEST  2 VIEW  COMPARISON:  05/22/13  FINDINGS: The heart size and mediastinal contours are within normal limits. Both lungs are clear. The visualized skeletal structures are unremarkable.  IMPRESSION: No active cardiopulmonary disease.   Electronically Signed    By: Andreas Newport M.D.   On: 07/07/2015 21:12   Nm Myocar Multi W/spect W/wall Motion / Ef  07/08/2015   CLINICAL DATA:  64 year old with precordial chest pain.  EXAM: MYOCARDIAL IMAGING WITH SPECT (REST AND PHARMACOLOGIC-STRESS)  GATED LEFT VENTRICULAR WALL MOTION STUDY  LEFT VENTRICULAR EJECTION FRACTION  TECHNIQUE: Standard myocardial SPECT imaging was performed after resting intravenous injection of 10 mCi Tc-74m sestamibi. Subsequently, intravenous infusion of Lexiscan was performed under the supervision of the Cardiology staff. At peak effect of the drug, 30 mCi Tc-2m sestamibi was injected intravenously and standard myocardial SPECT imaging was performed. Quantitative gated imaging was also performed to evaluate left ventricular wall motion, and estimate left ventricular ejection fraction.  COMPARISON:  Chest radiograph 07/07/2015  FINDINGS: Perfusion: There is a large amount of the radiopharmaceutical uptake in the stomach. The stomach activity did not clear with walking or by drinking fluids. As a result, the study is non diagnostic. Cannot exclude reversibility along the anterior wall. Uptake at the apex is uncertain. Question decreased uptake along the lateral wall on both rest and stress images is equivocal.  Wall Motion: Unusual wall motion related to the stomach artifact.  Left Ventricular Ejection Fraction: 72 %  End diastolic volume 50 ml  End systolic volume 14 ml  IMPRESSION: The study is non diagnostic due to a large amount of gut uptake.   Electronically Signed   By: Markus Daft M.D.   On: 07/08/2015 15:18     Wt Readings  from Last 3 Encounters:  07/11/15 115 lb 12.8 oz (52.527 kg)  07/08/15 118 lb 3.2 oz (53.615 kg)     Past Medical History  Diagnosis Date  . Hypercholesterolemia     Current Outpatient Prescriptions  Medication Sig Dispense Refill  . aspirin EC 81 MG tablet Take 81 mg by mouth daily.    . cholecalciferol (VITAMIN D) 1000 UNITS tablet Take 1,000 Units by  mouth daily.    . Multiple Vitamin (MULTIVITAMIN WITH MINERALS) TABS tablet Take 1 tablet by mouth daily.    . Omega-3 Fatty Acids (FISH OIL) 1000 MG CAPS Take 1,000 mg by mouth daily.    . simvastatin (ZOCOR) 20 MG tablet Take 20 mg by mouth daily.  0  . TURMERIC PO Take 1 Dose by mouth daily.    . vitamin C (ASCORBIC ACID) 250 MG tablet Take 250 mg by mouth daily.     No current facility-administered medications for this visit.     Allergies:   Review of patient's allergies indicates no known allergies.   Social History:  The patient  reports that she has never smoked. She does not have any smokeless tobacco history on file. She reports that she drinks alcohol. She reports that she does not use illicit drugs.   Family History:  The patient's family history is not on file.   ROS:  Please see the history of present illness.  All other systems reviewed and negative.    PHYSICAL EXAM: VS:  BP 102/72 mmHg  Pulse 81  Ht 5' (1.524 m)  Wt 115 lb 12.8 oz (52.527 kg)  BMI 22.62 kg/m2  SpO2 91% Well nourished, well developed, in no acute distress HEENT: normal Neck: no JVD Cardiac:  normal S1, S2; RRR; no murmur Lungs:  clear to auscultation bilaterally, no wheezing, rhonchi or rales Abd: soft, nontender, no hepatomegaly Ext: no edema Skin: warm and dry Neuro:  CNs 2-12 intact, no focal abnormalities noted  EKG:  none   ASSESSMENT AND PLAN:  Helen King is a 64 y.o. female with a hx of HLD and no prior cardiac hx who presents for post hospital follow up after a CP rule out.  Chest pain- no further pain. Stress ECHO normal with no evidence of ischemia. She wants to know what caused her CP. I went over different causes of chest pain including GERD, esophogeal spasm and costochondritis. She does not need to follow up with cardiology at this time  HLD- continue statin and follow with PCP   Disposition:  No follow up required. She may call us if she has any problems in the  future.    Signed, Vesta Mixer, PA-C, MHS 07/11/2015 1:54 PM    [facility] [address], [place], [place]  [address] Phone: [phone]; Fax: [phone]

## 2016-04-13 DIAGNOSIS — L309 Dermatitis, unspecified: Secondary | ICD-10-CM | POA: Diagnosis not present

## 2016-04-13 DIAGNOSIS — N644 Mastodynia: Secondary | ICD-10-CM | POA: Diagnosis not present

## 2016-04-15 ENCOUNTER — Other Ambulatory Visit: Payer: Self-pay | Admitting: Family Medicine

## 2016-04-15 DIAGNOSIS — N644 Mastodynia: Secondary | ICD-10-CM

## 2016-04-22 ENCOUNTER — Ambulatory Visit
Admission: RE | Admit: 2016-04-22 | Discharge: 2016-04-22 | Disposition: A | Discharge status: Home / Self Care | Payer: Medicare Other | Source: Ambulatory Visit | Attending: Family Medicine | Admitting: Family Medicine

## 2016-04-22 DIAGNOSIS — N644 Mastodynia: Secondary | ICD-10-CM

## 2016-06-22 ENCOUNTER — Other Ambulatory Visit: Payer: Self-pay | Admitting: Family Medicine

## 2016-06-22 ENCOUNTER — Other Ambulatory Visit (HOSPITAL_COMMUNITY)
Admission: RE | Admit: 2016-06-22 | Discharge: 2016-06-22 | Disposition: A | Discharge status: Home / Self Care | Payer: Medicare Other | Source: Ambulatory Visit | Attending: Family Medicine | Admitting: Family Medicine

## 2016-06-22 DIAGNOSIS — Z01411 Encounter for gynecological examination (general) (routine) with abnormal findings: Secondary | ICD-10-CM | POA: Diagnosis not present

## 2016-06-22 DIAGNOSIS — Z9889 Other specified postprocedural states: Secondary | ICD-10-CM | POA: Diagnosis not present

## 2016-06-22 DIAGNOSIS — E7800 Pure hypercholesterolemia, unspecified: Secondary | ICD-10-CM | POA: Diagnosis not present

## 2016-06-22 DIAGNOSIS — Z23 Encounter for immunization: Secondary | ICD-10-CM | POA: Diagnosis not present

## 2016-06-22 DIAGNOSIS — Z0000 Encounter for general adult medical examination without abnormal findings: Secondary | ICD-10-CM | POA: Diagnosis not present

## 2016-06-22 DIAGNOSIS — Z79899 Other long term (current) drug therapy: Secondary | ICD-10-CM | POA: Diagnosis not present

## 2016-06-22 DIAGNOSIS — M810 Age-related osteoporosis without current pathological fracture: Secondary | ICD-10-CM | POA: Diagnosis not present

## 2016-06-23 LAB — CYTOLOGY - PAP

## 2016-08-05 DIAGNOSIS — E2839 Other primary ovarian failure: Secondary | ICD-10-CM | POA: Diagnosis not present

## 2016-08-05 DIAGNOSIS — M810 Age-related osteoporosis without current pathological fracture: Secondary | ICD-10-CM | POA: Diagnosis not present

## 2016-08-19 DIAGNOSIS — Z1211 Encounter for screening for malignant neoplasm of colon: Secondary | ICD-10-CM | POA: Diagnosis not present

## 2016-09-09 DIAGNOSIS — N368 Other specified disorders of urethra: Secondary | ICD-10-CM | POA: Diagnosis not present

## 2016-11-09 DIAGNOSIS — N361 Urethral diverticulum: Secondary | ICD-10-CM | POA: Diagnosis not present

## 2016-11-18 DIAGNOSIS — R21 Rash and other nonspecific skin eruption: Secondary | ICD-10-CM | POA: Diagnosis not present

## 2016-11-18 DIAGNOSIS — L309 Dermatitis, unspecified: Secondary | ICD-10-CM | POA: Diagnosis not present

## 2016-12-07 DIAGNOSIS — L509 Urticaria, unspecified: Secondary | ICD-10-CM | POA: Diagnosis not present

## 2017-03-25 ENCOUNTER — Other Ambulatory Visit: Payer: Self-pay | Admitting: Family Medicine

## 2017-03-25 DIAGNOSIS — Z1231 Encounter for screening mammogram for malignant neoplasm of breast: Secondary | ICD-10-CM

## 2017-04-25 ENCOUNTER — Ambulatory Visit
Admission: RE | Admit: 2017-04-25 | Discharge: 2017-04-25 | Disposition: A | Discharge status: Home / Self Care | Payer: Medicare Other | Source: Ambulatory Visit | Attending: Family Medicine | Admitting: Family Medicine

## 2017-04-25 DIAGNOSIS — Z1231 Encounter for screening mammogram for malignant neoplasm of breast: Secondary | ICD-10-CM | POA: Diagnosis not present

## 2017-05-10 DIAGNOSIS — N361 Urethral diverticulum: Secondary | ICD-10-CM | POA: Diagnosis not present

## 2017-05-26 IMAGING — MG 2D DIGITAL DIAGNOSTIC BILATERAL MAMMOGRAM WITH CAD AND ADJUNCT T
1 series · 1 of 1 positions shown · non-contrast
Comparison: Previous exam(s).

CLINICAL DATA: 64-year-old female with intermittent diffuse left
breast pain for 5 years since a fall. The pain has been more focal
in the left retroareolar region for the past several months. The
patient denies any lumps or nipple discharge.

EXAM:
2D DIGITAL DIAGNOSTIC BILATERAL MAMMOGRAM WITH CAD AND ADJUNCT TOMO
ULTRASOUND LEFT BREAST

[R TAN]
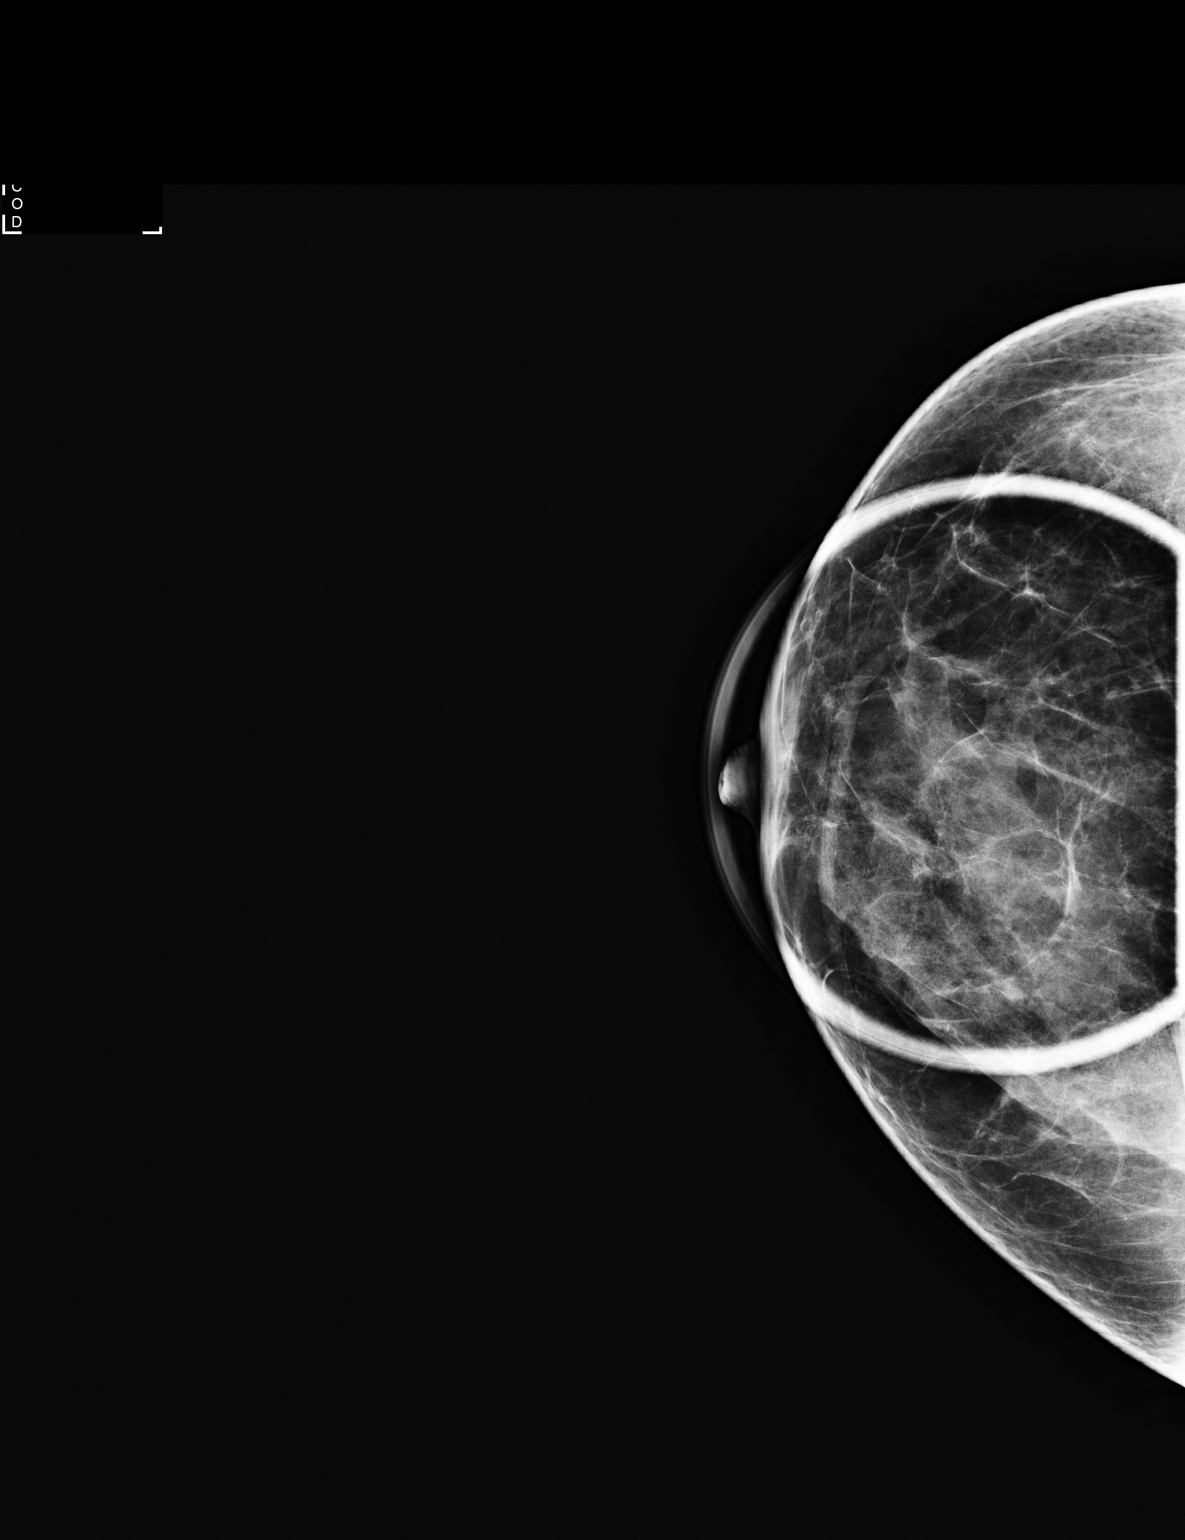

[1 of 1 positions shown; findings below may reference images not displayed]

ACR Breast Density Category c: The breast tissue is heterogeneously
dense, which may obscure small masses.
FINDINGS: No suspicious mass, calcifications, or other abnormality is
identified within either breast.

Mammographic images were processed with CAD.

On physical exam, no discrete mass is felt in the area of patient's
concern in the left retroareolar region.

Targeted ultrasound is performed, showing no suspicious cystic or
solid sonographic finding in the area of concern.
IMPRESSION: No mammographic or sonographic evidence of malignancy.

RECOMMENDATION:
1. Screening mammogram in one year.(Code:MT-6-9EU)
2. The patient was instructed to follow-up with her referring
physician regarding her left breast pain.

I have discussed the findings and recommendations with the patient.
Results were also provided in writing at the conclusion of the
visit. If applicable, a reminder letter will be sent to the patient
regarding the next appointment.

BI-RADS CATEGORY  1: Negative.

## 2017-06-30 DIAGNOSIS — Z0000 Encounter for general adult medical examination without abnormal findings: Secondary | ICD-10-CM | POA: Diagnosis not present

## 2017-06-30 DIAGNOSIS — Z79899 Other long term (current) drug therapy: Secondary | ICD-10-CM | POA: Diagnosis not present

## 2017-06-30 DIAGNOSIS — Z23 Encounter for immunization: Secondary | ICD-10-CM | POA: Diagnosis not present

## 2017-06-30 DIAGNOSIS — E7800 Pure hypercholesterolemia, unspecified: Secondary | ICD-10-CM | POA: Diagnosis not present

## 2017-06-30 DIAGNOSIS — Z131 Encounter for screening for diabetes mellitus: Secondary | ICD-10-CM | POA: Diagnosis not present

## 2017-06-30 DIAGNOSIS — M810 Age-related osteoporosis without current pathological fracture: Secondary | ICD-10-CM | POA: Diagnosis not present

## 2017-06-30 DIAGNOSIS — Z1159 Encounter for screening for other viral diseases: Secondary | ICD-10-CM | POA: Diagnosis not present

## 2017-10-13 DIAGNOSIS — H40013 Open angle with borderline findings, low risk, bilateral: Secondary | ICD-10-CM | POA: Diagnosis not present

## 2017-10-13 DIAGNOSIS — H2513 Age-related nuclear cataract, bilateral: Secondary | ICD-10-CM | POA: Diagnosis not present

## 2017-11-15 DIAGNOSIS — N362 Urethral caruncle: Secondary | ICD-10-CM | POA: Diagnosis not present

## 2018-02-08 DIAGNOSIS — N644 Mastodynia: Secondary | ICD-10-CM | POA: Diagnosis not present

## 2018-02-08 DIAGNOSIS — R079 Chest pain, unspecified: Secondary | ICD-10-CM | POA: Diagnosis not present

## 2018-02-08 DIAGNOSIS — R42 Dizziness and giddiness: Secondary | ICD-10-CM | POA: Diagnosis not present

## 2018-02-13 ENCOUNTER — Other Ambulatory Visit: Payer: Self-pay | Admitting: Family Medicine

## 2018-02-13 DIAGNOSIS — N644 Mastodynia: Secondary | ICD-10-CM

## 2018-02-21 ENCOUNTER — Ambulatory Visit
Admission: RE | Admit: 2018-02-21 | Discharge: 2018-02-21 | Disposition: A | Discharge status: Home / Self Care | Payer: Medicare Other | Source: Ambulatory Visit | Attending: Family Medicine | Admitting: Family Medicine

## 2018-02-21 ENCOUNTER — Other Ambulatory Visit: Payer: Self-pay | Admitting: Family Medicine

## 2018-02-21 ENCOUNTER — Ambulatory Visit: Admission: RE | Admit: 2018-02-21 | Payer: Medicare Other | Source: Ambulatory Visit

## 2018-02-21 DIAGNOSIS — N644 Mastodynia: Secondary | ICD-10-CM

## 2018-02-21 DIAGNOSIS — N6489 Other specified disorders of breast: Secondary | ICD-10-CM | POA: Diagnosis not present

## 2018-02-21 DIAGNOSIS — R922 Inconclusive mammogram: Secondary | ICD-10-CM | POA: Diagnosis not present

## 2018-03-23 ENCOUNTER — Other Ambulatory Visit: Payer: Self-pay | Admitting: Family Medicine

## 2018-03-23 DIAGNOSIS — Z1231 Encounter for screening mammogram for malignant neoplasm of breast: Secondary | ICD-10-CM

## 2018-04-27 ENCOUNTER — Ambulatory Visit: Payer: Medicare Other

## 2018-05-12 ENCOUNTER — Ambulatory Visit
Admission: RE | Admit: 2018-05-12 | Discharge: 2018-05-12 | Disposition: A | Discharge status: Home / Self Care | Payer: Medicare Other | Source: Ambulatory Visit | Attending: Family Medicine | Admitting: Family Medicine

## 2018-05-12 DIAGNOSIS — Z1231 Encounter for screening mammogram for malignant neoplasm of breast: Secondary | ICD-10-CM | POA: Diagnosis not present

## 2018-07-26 DIAGNOSIS — M79671 Pain in right foot: Secondary | ICD-10-CM | POA: Diagnosis not present

## 2018-08-15 ENCOUNTER — Other Ambulatory Visit (HOSPITAL_COMMUNITY)
Admission: RE | Admit: 2018-08-15 | Discharge: 2018-08-15 | Disposition: A | Discharge status: Home / Self Care | Payer: Medicare Other | Source: Ambulatory Visit | Attending: Family Medicine | Admitting: Family Medicine

## 2018-08-15 ENCOUNTER — Other Ambulatory Visit: Payer: Self-pay | Admitting: Family Medicine

## 2018-08-15 DIAGNOSIS — Z9889 Other specified postprocedural states: Secondary | ICD-10-CM | POA: Diagnosis not present

## 2018-08-15 DIAGNOSIS — Z79899 Other long term (current) drug therapy: Secondary | ICD-10-CM | POA: Diagnosis not present

## 2018-08-15 DIAGNOSIS — Z23 Encounter for immunization: Secondary | ICD-10-CM | POA: Diagnosis not present

## 2018-08-15 DIAGNOSIS — M810 Age-related osteoporosis without current pathological fracture: Secondary | ICD-10-CM | POA: Diagnosis not present

## 2018-08-15 DIAGNOSIS — Z01411 Encounter for gynecological examination (general) (routine) with abnormal findings: Secondary | ICD-10-CM | POA: Diagnosis not present

## 2018-08-15 DIAGNOSIS — E7800 Pure hypercholesterolemia, unspecified: Secondary | ICD-10-CM | POA: Diagnosis not present

## 2018-08-15 DIAGNOSIS — Z0000 Encounter for general adult medical examination without abnormal findings: Secondary | ICD-10-CM | POA: Diagnosis not present

## 2018-08-15 DIAGNOSIS — E559 Vitamin D deficiency, unspecified: Secondary | ICD-10-CM | POA: Diagnosis not present

## 2018-08-15 DIAGNOSIS — N361 Urethral diverticulum: Secondary | ICD-10-CM | POA: Diagnosis not present

## 2018-08-16 LAB — CYTOLOGY - PAP
Diagnosis: NEGATIVE
HPV: NOT DETECTED

## 2018-11-30 DIAGNOSIS — N898 Other specified noninflammatory disorders of vagina: Secondary | ICD-10-CM | POA: Diagnosis not present

## 2018-11-30 DIAGNOSIS — E2839 Other primary ovarian failure: Secondary | ICD-10-CM | POA: Diagnosis not present

## 2018-11-30 DIAGNOSIS — N952 Postmenopausal atrophic vaginitis: Secondary | ICD-10-CM | POA: Diagnosis not present

## 2018-12-07 ENCOUNTER — Other Ambulatory Visit: Payer: Self-pay | Admitting: Family Medicine

## 2018-12-07 DIAGNOSIS — E2839 Other primary ovarian failure: Secondary | ICD-10-CM

## 2019-05-22 ENCOUNTER — Other Ambulatory Visit: Payer: Self-pay | Admitting: Family Medicine

## 2019-05-22 DIAGNOSIS — Z1231 Encounter for screening mammogram for malignant neoplasm of breast: Secondary | ICD-10-CM

## 2019-06-20 DIAGNOSIS — Z23 Encounter for immunization: Secondary | ICD-10-CM | POA: Diagnosis not present

## 2019-07-17 DIAGNOSIS — N898 Other specified noninflammatory disorders of vagina: Secondary | ICD-10-CM | POA: Diagnosis not present

## 2019-07-25 ENCOUNTER — Ambulatory Visit
Admission: RE | Admit: 2019-07-25 | Discharge: 2019-07-25 | Disposition: A | Discharge status: Home / Self Care | Payer: Medicare Other | Source: Ambulatory Visit | Attending: Family Medicine | Admitting: Family Medicine

## 2019-07-25 ENCOUNTER — Other Ambulatory Visit: Payer: Self-pay

## 2019-07-25 DIAGNOSIS — Z1231 Encounter for screening mammogram for malignant neoplasm of breast: Secondary | ICD-10-CM | POA: Diagnosis not present

## 2019-07-25 DIAGNOSIS — Z780 Asymptomatic menopausal state: Secondary | ICD-10-CM | POA: Diagnosis not present

## 2019-07-25 DIAGNOSIS — M8589 Other specified disorders of bone density and structure, multiple sites: Secondary | ICD-10-CM | POA: Diagnosis not present

## 2019-07-25 DIAGNOSIS — E2839 Other primary ovarian failure: Secondary | ICD-10-CM

## 2019-08-07 DIAGNOSIS — N898 Other specified noninflammatory disorders of vagina: Secondary | ICD-10-CM | POA: Diagnosis not present

## 2019-12-18 ENCOUNTER — Other Ambulatory Visit: Payer: Self-pay | Admitting: Family Medicine

## 2019-12-18 ENCOUNTER — Other Ambulatory Visit (HOSPITAL_COMMUNITY)
Admission: RE | Admit: 2019-12-18 | Discharge: 2019-12-18 | Disposition: A | Discharge status: Home / Self Care | Payer: Medicare Other | Source: Ambulatory Visit | Attending: Family Medicine | Admitting: Family Medicine

## 2019-12-18 DIAGNOSIS — Z01411 Encounter for gynecological examination (general) (routine) with abnormal findings: Secondary | ICD-10-CM | POA: Diagnosis present

## 2019-12-19 LAB — CYTOLOGY - PAP: Diagnosis: NEGATIVE

## 2020-01-16 ENCOUNTER — Other Ambulatory Visit: Payer: Self-pay | Admitting: Family Medicine

## 2020-01-16 ENCOUNTER — Ambulatory Visit
Admission: RE | Admit: 2020-01-16 | Discharge: 2020-01-16 | Disposition: A | Discharge status: Home / Self Care | Payer: Medicare Other | Source: Ambulatory Visit | Attending: Family Medicine | Admitting: Family Medicine

## 2020-01-16 DIAGNOSIS — K37 Unspecified appendicitis: Secondary | ICD-10-CM

## 2020-01-16 DIAGNOSIS — R1031 Right lower quadrant pain: Secondary | ICD-10-CM

## 2020-01-16 MED ORDER — IOPAMIDOL (ISOVUE-300) INJECTION 61%
100.0000 mL | Freq: Once | INTRAVENOUS | Status: AC | PRN
  Administered 2020-01-16: 09:00:00 100 mL via INTRAVENOUS

## 2020-05-08 ENCOUNTER — Other Ambulatory Visit: Payer: Self-pay | Admitting: Family Medicine

## 2020-05-08 DIAGNOSIS — R0789 Other chest pain: Secondary | ICD-10-CM

## 2020-05-08 DIAGNOSIS — N63 Unspecified lump in unspecified breast: Secondary | ICD-10-CM

## 2020-05-21 ENCOUNTER — Other Ambulatory Visit: Payer: Self-pay

## 2020-05-21 ENCOUNTER — Ambulatory Visit
Admission: RE | Admit: 2020-05-21 | Discharge: 2020-05-21 | Disposition: A | Discharge status: Home / Self Care | Payer: Medicare Other | Source: Ambulatory Visit | Attending: Family Medicine | Admitting: Family Medicine

## 2020-05-21 DIAGNOSIS — N63 Unspecified lump in unspecified breast: Secondary | ICD-10-CM

## 2020-05-21 DIAGNOSIS — R0789 Other chest pain: Secondary | ICD-10-CM

## 2020-09-03 ENCOUNTER — Other Ambulatory Visit: Payer: Self-pay | Admitting: Family Medicine

## 2020-09-03 DIAGNOSIS — Z1231 Encounter for screening mammogram for malignant neoplasm of breast: Secondary | ICD-10-CM

## 2020-10-16 ENCOUNTER — Ambulatory Visit
Admission: RE | Admit: 2020-10-16 | Discharge: 2020-10-16 | Disposition: A | Discharge status: Home / Self Care | Payer: Medicare Other | Source: Ambulatory Visit | Attending: Family Medicine | Admitting: Family Medicine

## 2020-10-16 ENCOUNTER — Other Ambulatory Visit: Payer: Self-pay

## 2020-10-16 DIAGNOSIS — Z1231 Encounter for screening mammogram for malignant neoplasm of breast: Secondary | ICD-10-CM

## 2021-01-06 DIAGNOSIS — Z0000 Encounter for general adult medical examination without abnormal findings: Secondary | ICD-10-CM | POA: Diagnosis not present

## 2021-01-06 DIAGNOSIS — D135 Benign neoplasm of extrahepatic bile ducts: Secondary | ICD-10-CM | POA: Diagnosis not present

## 2021-01-06 DIAGNOSIS — R61 Generalized hyperhidrosis: Secondary | ICD-10-CM | POA: Diagnosis not present

## 2021-01-06 DIAGNOSIS — I708 Atherosclerosis of other arteries: Secondary | ICD-10-CM | POA: Diagnosis not present

## 2021-01-06 DIAGNOSIS — M810 Age-related osteoporosis without current pathological fracture: Secondary | ICD-10-CM | POA: Diagnosis not present

## 2021-01-06 DIAGNOSIS — E7800 Pure hypercholesterolemia, unspecified: Secondary | ICD-10-CM | POA: Diagnosis not present

## 2021-01-06 DIAGNOSIS — E559 Vitamin D deficiency, unspecified: Secondary | ICD-10-CM | POA: Diagnosis not present

## 2021-01-06 DIAGNOSIS — Z9889 Other specified postprocedural states: Secondary | ICD-10-CM | POA: Diagnosis not present

## 2021-01-06 DIAGNOSIS — Z23 Encounter for immunization: Secondary | ICD-10-CM | POA: Diagnosis not present

## 2021-02-11 DIAGNOSIS — Z23 Encounter for immunization: Secondary | ICD-10-CM | POA: Diagnosis not present

## 2021-05-18 DIAGNOSIS — U071 COVID-19: Secondary | ICD-10-CM | POA: Diagnosis not present

## 2021-05-18 DIAGNOSIS — R059 Cough, unspecified: Secondary | ICD-10-CM | POA: Diagnosis not present

## 2021-07-09 DIAGNOSIS — Z23 Encounter for immunization: Secondary | ICD-10-CM | POA: Diagnosis not present

## 2021-10-07 ENCOUNTER — Other Ambulatory Visit: Payer: Self-pay | Admitting: Family Medicine

## 2021-10-07 DIAGNOSIS — Z1231 Encounter for screening mammogram for malignant neoplasm of breast: Secondary | ICD-10-CM

## 2021-10-21 ENCOUNTER — Ambulatory Visit: Payer: Medicare Other

## 2021-10-22 ENCOUNTER — Other Ambulatory Visit: Payer: Self-pay

## 2021-10-22 ENCOUNTER — Ambulatory Visit
Admission: RE | Admit: 2021-10-22 | Discharge: 2021-10-22 | Disposition: A | Discharge status: Home / Self Care | Payer: Medicare Other | Source: Ambulatory Visit | Attending: Family Medicine | Admitting: Family Medicine

## 2021-10-22 DIAGNOSIS — Z1231 Encounter for screening mammogram for malignant neoplasm of breast: Secondary | ICD-10-CM

## 2022-02-09 DIAGNOSIS — I708 Atherosclerosis of other arteries: Secondary | ICD-10-CM | POA: Diagnosis not present

## 2022-02-09 DIAGNOSIS — D135 Benign neoplasm of extrahepatic bile ducts: Secondary | ICD-10-CM | POA: Diagnosis not present

## 2022-02-09 DIAGNOSIS — Z79899 Other long term (current) drug therapy: Secondary | ICD-10-CM | POA: Diagnosis not present

## 2022-02-09 DIAGNOSIS — N368 Other specified disorders of urethra: Secondary | ICD-10-CM | POA: Diagnosis not present

## 2022-02-09 DIAGNOSIS — R413 Other amnesia: Secondary | ICD-10-CM | POA: Diagnosis not present

## 2022-02-09 DIAGNOSIS — E7800 Pure hypercholesterolemia, unspecified: Secondary | ICD-10-CM | POA: Diagnosis not present

## 2022-02-09 DIAGNOSIS — M4180 Other forms of scoliosis, site unspecified: Secondary | ICD-10-CM | POA: Diagnosis not present

## 2022-02-09 DIAGNOSIS — N952 Postmenopausal atrophic vaginitis: Secondary | ICD-10-CM | POA: Diagnosis not present

## 2022-02-09 DIAGNOSIS — E559 Vitamin D deficiency, unspecified: Secondary | ICD-10-CM | POA: Diagnosis not present

## 2022-02-09 DIAGNOSIS — Z0000 Encounter for general adult medical examination without abnormal findings: Secondary | ICD-10-CM | POA: Diagnosis not present

## 2022-02-09 DIAGNOSIS — G3184 Mild cognitive impairment, so stated: Secondary | ICD-10-CM | POA: Diagnosis not present

## 2022-02-09 DIAGNOSIS — M810 Age-related osteoporosis without current pathological fracture: Secondary | ICD-10-CM | POA: Diagnosis not present

## 2022-02-15 ENCOUNTER — Other Ambulatory Visit: Payer: Self-pay | Admitting: Family Medicine

## 2022-02-15 DIAGNOSIS — E2839 Other primary ovarian failure: Secondary | ICD-10-CM

## 2022-06-16 DIAGNOSIS — Z23 Encounter for immunization: Secondary | ICD-10-CM | POA: Diagnosis not present

## 2022-10-21 ENCOUNTER — Other Ambulatory Visit: Payer: Self-pay | Admitting: Family Medicine

## 2022-10-21 DIAGNOSIS — Z1231 Encounter for screening mammogram for malignant neoplasm of breast: Secondary | ICD-10-CM

## 2022-11-24 ENCOUNTER — Ambulatory Visit
Admission: RE | Admit: 2022-11-24 | Discharge: 2022-11-24 | Disposition: A | Discharge status: Home / Self Care | Payer: Medicare Other | Source: Ambulatory Visit | Attending: Family Medicine | Admitting: Family Medicine

## 2022-11-24 DIAGNOSIS — Z780 Asymptomatic menopausal state: Secondary | ICD-10-CM | POA: Diagnosis not present

## 2022-11-24 DIAGNOSIS — M8589 Other specified disorders of bone density and structure, multiple sites: Secondary | ICD-10-CM | POA: Diagnosis not present

## 2022-11-24 DIAGNOSIS — E2839 Other primary ovarian failure: Secondary | ICD-10-CM

## 2022-12-01 DIAGNOSIS — L509 Urticaria, unspecified: Secondary | ICD-10-CM | POA: Diagnosis not present

## 2022-12-09 ENCOUNTER — Ambulatory Visit
Admission: RE | Admit: 2022-12-09 | Discharge: 2022-12-09 | Disposition: A | Discharge status: Home / Self Care | Payer: Medicare Other | Source: Ambulatory Visit | Attending: Family Medicine | Admitting: Family Medicine

## 2022-12-09 DIAGNOSIS — Z1231 Encounter for screening mammogram for malignant neoplasm of breast: Secondary | ICD-10-CM

## 2023-02-22 DIAGNOSIS — E7800 Pure hypercholesterolemia, unspecified: Secondary | ICD-10-CM | POA: Diagnosis not present

## 2023-02-22 DIAGNOSIS — Z79899 Other long term (current) drug therapy: Secondary | ICD-10-CM | POA: Diagnosis not present

## 2023-02-22 DIAGNOSIS — E559 Vitamin D deficiency, unspecified: Secondary | ICD-10-CM | POA: Diagnosis not present

## 2023-02-22 DIAGNOSIS — Z0000 Encounter for general adult medical examination without abnormal findings: Secondary | ICD-10-CM | POA: Diagnosis not present

## 2023-08-02 DIAGNOSIS — Z23 Encounter for immunization: Secondary | ICD-10-CM | POA: Diagnosis not present

## 2024-01-03 DIAGNOSIS — M549 Dorsalgia, unspecified: Secondary | ICD-10-CM | POA: Diagnosis not present

## 2024-01-06 ENCOUNTER — Other Ambulatory Visit: Payer: Self-pay | Admitting: Family Medicine

## 2024-01-06 DIAGNOSIS — Z1231 Encounter for screening mammogram for malignant neoplasm of breast: Secondary | ICD-10-CM

## 2024-01-31 ENCOUNTER — Ambulatory Visit
Admission: RE | Admit: 2024-01-31 | Discharge: 2024-01-31 | Disposition: A | Discharge status: Home / Self Care | Source: Ambulatory Visit | Attending: Family Medicine | Admitting: Family Medicine

## 2024-01-31 DIAGNOSIS — Z1231 Encounter for screening mammogram for malignant neoplasm of breast: Secondary | ICD-10-CM

## 2024-03-13 DIAGNOSIS — F331 Major depressive disorder, recurrent, moderate: Secondary | ICD-10-CM | POA: Diagnosis not present

## 2024-03-13 DIAGNOSIS — E559 Vitamin D deficiency, unspecified: Secondary | ICD-10-CM | POA: Diagnosis not present

## 2024-03-13 DIAGNOSIS — E7800 Pure hypercholesterolemia, unspecified: Secondary | ICD-10-CM | POA: Diagnosis not present

## 2024-03-13 DIAGNOSIS — Z0000 Encounter for general adult medical examination without abnormal findings: Secondary | ICD-10-CM | POA: Diagnosis not present

## 2024-03-13 DIAGNOSIS — Z79899 Other long term (current) drug therapy: Secondary | ICD-10-CM | POA: Diagnosis not present

## 2024-04-03 DIAGNOSIS — F331 Major depressive disorder, recurrent, moderate: Secondary | ICD-10-CM | POA: Diagnosis not present

## 2024-04-03 DIAGNOSIS — L304 Erythema intertrigo: Secondary | ICD-10-CM | POA: Diagnosis not present
